# Patient Record
Sex: Male | Born: 1970 | Race: White | Hispanic: No | Marital: Married | Smoking: Never smoker
Health system: Southern US, Community
[De-identification: ages and names within clinical notes are randomized; demographics above are authoritative.]

## PROBLEM LIST (undated history)

## (undated) DIAGNOSIS — J302 Other seasonal allergic rhinitis: Secondary | ICD-10-CM

## (undated) DIAGNOSIS — L4050 Arthropathic psoriasis, unspecified: Secondary | ICD-10-CM

## (undated) HISTORY — DX: Other seasonal allergic rhinitis: J30.2

## (undated) HISTORY — DX: Arthropathic psoriasis, unspecified: L40.50

---

## 2011-04-03 ENCOUNTER — Encounter: Payer: Self-pay | Admitting: Family Medicine

## 2011-04-03 ENCOUNTER — Ambulatory Visit (INDEPENDENT_AMBULATORY_CARE_PROVIDER_SITE_OTHER): Payer: Managed Care, Other (non HMO) | Admitting: Family Medicine

## 2011-04-03 DIAGNOSIS — R002 Palpitations: Secondary | ICD-10-CM | POA: Insufficient documentation

## 2011-04-03 DIAGNOSIS — L4050 Arthropathic psoriasis, unspecified: Secondary | ICD-10-CM

## 2011-04-03 DIAGNOSIS — Z0000 Encounter for general adult medical examination without abnormal findings: Secondary | ICD-10-CM

## 2011-04-03 NOTE — Assessment & Plan Note (Signed)
Reviewed preventative protocols and updated unless pt declined. Return fasting for blood work. Discussed healthy living, diet. rtc 1-2 yrs for f/u or as needed.

## 2011-04-03 NOTE — Patient Instructions (Signed)
Good to meet you today I think the heart flutter was likely from overdoing EtOH.  Try to limit intake.  If it happens again, please return to be seen. physcial today - think about flu and tetanus shot. Return at your convenience for fasting bloodwork.

## 2011-04-03 NOTE — Progress Notes (Signed)
Subjective:    Patient ID: James Hogan, male    DOB: Nov 12, 1970, 40 y.o.   MRN: [id number]  HPI CC; new pt, establish  Recently turned 40, would like CPE.  No prior PCP.  Heart palpitations - recent trip to [place] for birthday - overdid EtOH.  Was sitting and when stood up felt "flutter" of heart, lasted a few seconds.  Sat down and resolved with rest.  No problems since.  No hangover.  Denies CP, SOB, tightness, cough, wheeze, dizziness, HA.  Stays active.  Yesterday ran 2 mi on treadmill, felt fine.  No further palpitations.  Recently did P90X, lost 26 lbs.  Tries to run 3 mi QOD.  Psoriasis - seen derm, currently using OTC psoriasis cream.  Improved with tanning bed in past.  Possible arthritis, worse L 2nd toe - some deformity per pt.  Not currnetly bothering him.  Seen podiatrist in past.  Preventative: Declines flu and tetanus although due.  Afraid of needles.  Medications and allergies reviewed and updated in chart.  Past histories reviewed and updated if relevant as below. Patient Active Problem List  Diagnoses  . Healthcare maintenance  . Palpitation  . Psoriatic arthritis   Past Medical History  Diagnosis Date  . Psoriatic arthritis     with psoriasis  . Seasonal allergies   . History of chicken pox    History reviewed. No pertinent past surgical history. History  Substance Use Topics  . Smoking status: Never Smoker   . Smokeless tobacco: Not on file  . Alcohol Use: Yes     on weekends   Family History  Problem Relation Age of Onset  . Aortic dissection Father   . Hypertension Father   . Psoriasis Father   . Coronary artery disease Maternal Grandfather   . Cancer Paternal Grandfather     lung, smoker  . Diabetes Neg Hx   . Hyperlipidemia Neg Hx    Allergies no known allergies No current outpatient prescriptions on file prior to visit.   Review of Systems  Constitutional: Negative for fever, chills, activity change, appetite change,  fatigue and unexpected weight change.  HENT: Negative for hearing loss and neck pain.   Eyes: Negative for visual disturbance.  Respiratory: Negative for cough, chest tightness, shortness of breath and wheezing.   Cardiovascular: Negative for chest pain, palpitations and leg swelling.  Gastrointestinal: Negative for nausea, vomiting, abdominal pain, diarrhea, constipation, blood in stool and abdominal distention.  Genitourinary: Negative for hematuria and difficulty urinating.  Musculoskeletal: Negative for myalgias and arthralgias.  Skin: Negative for rash.  Neurological: Negative for dizziness, seizures, syncope and headaches.  Hematological: Does not bruise/bleed easily.  Psychiatric/Behavioral: Negative for dysphoric mood. The patient is not nervous/anxious.        Objective:   Physical Exam  Nursing note and vitals reviewed. Constitutional: He is oriented to person, place, and time. He appears well-developed and well-nourished. No distress.  HENT:  Head: Normocephalic and atraumatic.  Right Ear: External ear normal.  Left Ear: External ear normal.  Nose: Nose normal.  Mouth/Throat: Oropharynx is clear and moist. No oropharyngeal exudate.  Eyes: Conjunctivae and EOM are normal. Pupils are equal, round, and reactive to light.  Neck: Normal range of motion. Neck supple. No thyromegaly present.  Cardiovascular: Normal rate, regular rhythm, normal heart sounds and intact distal pulses.   No murmur heard. Pulses:      Radial pulses are 2+ on the right side, and 2+ on the left side.  Pulmonary/Chest: Effort normal and breath sounds normal. No respiratory distress. He has no wheezes. He has no rales.  Abdominal: Soft. Bowel sounds are normal. He exhibits no distension and no mass. There is no tenderness. There is no rebound and no guarding.  Musculoskeletal: Normal range of motion. He exhibits no edema.  Lymphadenopathy:    He has no cervical adenopathy.  Neurological: He is alert  and oriented to person, place, and time.       CN grossly intact, station and gait intact  Skin: Skin is warm and dry. No rash noted.       Pitting of finger nails  Psychiatric: He has a normal mood and affect. His behavior is normal. Judgment and thought content normal.          Assessment & Plan:

## 2011-04-04 NOTE — Assessment & Plan Note (Signed)
Anticipate due to increase in EtOH intake during that period. Recommend limiting intake. If palpitations occur again, return for further evaluation. Check blood work to r/o other cause

## 2012-12-10 ENCOUNTER — Telehealth: Payer: Self-pay | Admitting: Family Medicine

## 2012-12-10 NOTE — Telephone Encounter (Signed)
Patient called for a physical but he wants to change his primary doctor from Dr. Sharen Hones to Dr. Alphonsus Sias.  He said he wanted a doctor with more experience.  Please advise as to your wishes on this request.  [phone]

## 2012-12-10 NOTE — Telephone Encounter (Signed)
I've seen him once.  Fine by me.

## 2012-12-11 NOTE — Telephone Encounter (Signed)
Okay to set up appt with me

## 2012-12-11 NOTE — Telephone Encounter (Signed)
I left a message on patient's voice mail that he can switch to Dr.Letvak.  I asked him to call me back, if he needs to schedule an appointment.

## 2013-03-04 ENCOUNTER — Encounter: Payer: Self-pay | Admitting: Internal Medicine

## 2013-03-04 ENCOUNTER — Ambulatory Visit (INDEPENDENT_AMBULATORY_CARE_PROVIDER_SITE_OTHER): Payer: Managed Care, Other (non HMO) | Admitting: Internal Medicine

## 2013-03-04 VITALS — BP 108/68 | HR 56 | Temp 98.2°F | Ht 70.0 in | Wt 226.0 lb

## 2013-03-04 DIAGNOSIS — Z23 Encounter for immunization: Secondary | ICD-10-CM

## 2013-03-04 DIAGNOSIS — Z0000 Encounter for general adult medical examination without abnormal findings: Secondary | ICD-10-CM

## 2013-03-04 DIAGNOSIS — L4050 Arthropathic psoriasis, unspecified: Secondary | ICD-10-CM

## 2013-03-04 LAB — LIPID PANEL
Cholesterol: 215 mg/dL — ABNORMAL HIGH (ref 0–200)
HDL: 35.6 mg/dL — ABNORMAL LOW (ref >39.00)
Total CHOL/HDL Ratio: 6
VLDL: 42.8 mg/dL — ABNORMAL HIGH (ref 0.0–40.0)

## 2013-03-04 NOTE — Patient Instructions (Signed)
DASH Diet  The DASH diet stands for "Dietary Approaches to Stop Hypertension." It is a healthy eating plan that has been shown to reduce high blood pressure (hypertension) in as little as 14 days, while also possibly providing other significant health benefits. These other health benefits include reducing the risk of breast cancer after menopause and reducing the risk of type 2 diabetes, heart disease, colon cancer, and stroke. Health benefits also include weight loss and slowing kidney failure in patients with chronic kidney disease.   DIET GUIDELINES  · Limit salt (sodium). Your diet should contain less than 1500 mg of sodium daily.  · Limit refined or processed carbohydrates. Your diet should include mostly whole grains. Desserts and added sugars should be used sparingly.  · Include small amounts of heart-healthy fats. These types of fats include nuts, oils, and tub margarine. Limit saturated and trans fats. These fats have been shown to be harmful in the body.  CHOOSING FOODS   The following food groups are based on a 2000 calorie diet. See your Registered Dietitian for individual calorie needs.  Grains and Grain Products (6 to 8 servings daily)  · Eat More Often: Whole-wheat bread, brown rice, whole-grain or wheat pasta, quinoa, popcorn without added fat or salt (air popped).  · Eat Less Often: White bread, white pasta, white rice, cornbread.  Vegetables (4 to 5 servings daily)  · Eat More Often: Fresh, frozen, and canned vegetables. Vegetables may be raw, steamed, roasted, or grilled with a minimal amount of fat.  · Eat Less Often/Avoid: Creamed or fried vegetables. Vegetables in a cheese sauce.  Fruit (4 to 5 servings daily)  · Eat More Often: All fresh, canned (in natural juice), or frozen fruits. Dried fruits without added sugar. One hundred percent fruit juice (½ cup [237 mL] daily).  · Eat Less Often: Dried fruits with added sugar. Canned fruit in light or heavy syrup.  Lean Meats, Fish, and Poultry (2  servings or less daily. One serving is 3 to 4 oz [85-114 g]).  · Eat More Often: Ninety percent or leaner ground beef, tenderloin, sirloin. Round cuts of beef, chicken breast, turkey breast. All fish. Grill, bake, or broil your meat. Nothing should be fried.  · Eat Less Often/Avoid: Fatty cuts of meat, turkey, or chicken leg, thigh, or wing. Fried cuts of meat or fish.  Dairy (2 to 3 servings)  · Eat More Often: Low-fat or fat-free milk, low-fat plain or light yogurt, reduced-fat or part-skim cheese.  · Eat Less Often/Avoid: Milk (whole, 2%). Whole milk yogurt. Full-fat cheeses.  Nuts, Seeds, and Legumes (4 to 5 servings per week)  · Eat More Often: All without added salt.  · Eat Less Often/Avoid: Salted nuts and seeds, canned beans with added salt.  Fats and Sweets (limited)  · Eat More Often: Vegetable oils, tub margarines without trans fats, sugar-free gelatin. Mayonnaise and salad dressings.  · Eat Less Often/Avoid: Coconut oils, palm oils, butter, stick margarine, cream, half and half, cookies, candy, pie.  FOR MORE INFORMATION  The Dash Diet Eating Plan: www.dashdiet.org  Document Released: 06/21/2011 Document Revised: 09/24/2011 Document Reviewed: 06/21/2011  ExitCare® Patient Information ©2014 [employer].

## 2013-03-04 NOTE — Assessment & Plan Note (Signed)
Healthy Counseling done Will check cholesterol--no Rx for now even if high Check glucose Tdap

## 2013-03-04 NOTE — Progress Notes (Signed)
Subjective:    Patient ID: James Hogan, male    DOB: 1971-01-08, 42 y.o.   MRN: [id number]  HPI Here for physical Doing okay for the most part Mom had recent chest pain and found to have CAD and needed stent  Has been going to gym regularly ([employer]) Aerobic and weights Has been trimming down this summer  Psoriasis is mild Only using OTC creams for now  No current outpatient prescriptions on file prior to visit.   No current facility-administered medications on file prior to visit.    No Known Allergies  Past Medical History  Diagnosis Date  . Psoriatic arthritis     with minimal joint involvement.  . Seasonal allergies     No past surgical history on file.  Family History  Problem Relation Age of Onset  . Aortic dissection Father   . Hypertension Father   . Psoriasis Father   . Coronary artery disease Maternal Grandfather   . Cancer Paternal Grandfather     lung, smoker  . Diabetes Neg Hx   . Hyperlipidemia Neg Hx   . Heart disease Mother     History   Social History  . Marital Status: Married    Spouse Name: N/A    Number of Children: 2  . Years of Education: N/A   Occupational History  . Returns Therapist, music   Social History Main Topics  . Smoking status: Never Smoker   . Smokeless tobacco: Never Used  . Alcohol Use: Yes     Comment: on weekends  . Drug Use: No  . Sexual Activity: Not on file   Other Topics Concern  . Not on file   Social History Narrative   2nd marriage   His 2 children with mom in [place] most of the time   Wife has 3 children-- 1 daughter stays with them    Review of Systems  Constitutional: Negative for fatigue and unexpected weight change.       Wears seat belt  HENT: Positive for congestion and rhinorrhea. Negative for hearing loss, dental problem and tinnitus.        Regular with dentist  Eyes: Negative for visual disturbance.       No diplopia or unilateral vision loss  Respiratory: Negative  for cough, chest tightness and shortness of breath.   Cardiovascular: Negative for chest pain, palpitations and leg swelling.  Gastrointestinal: Negative for nausea, vomiting, abdominal pain, constipation and blood in stool.       No heartburn  Endocrine: Negative for cold intolerance and heat intolerance.  Genitourinary: Negative for urgency, frequency and difficulty urinating.       No sexual problems  Musculoskeletal: Positive for arthralgias. Negative for back pain and joint swelling.       Right wrist, thumb and ankle pain in past---not much recently  Skin: Positive for rash.       Small areas of psoriasis  Allergic/Immunologic: Positive for environmental allergies. Negative for immunocompromised state.       Uses loratadine prn  Neurological: Negative for dizziness, syncope, weakness, light-headedness, numbness and headaches.  Hematological: Negative for adenopathy. Does not bruise/bleed easily.  Psychiatric/Behavioral: Negative for sleep disturbance and dysphoric mood. The patient is nervous/anxious.        Mild situational anxiety---not regular       Objective:   Physical Exam  Constitutional: He is oriented to person, place, and time. He appears well-developed and well-nourished. No distress.  HENT:  Head: Normocephalic and atraumatic.  Right Ear: External ear normal.  Left Ear: External ear normal.  Mouth/Throat: Oropharynx is clear and moist. No oropharyngeal exudate.  Eyes: Conjunctivae and EOM are normal. Pupils are equal, round, and reactive to light.  Neck: Normal range of motion. Neck supple. No thyromegaly present.  Cardiovascular: Normal rate, regular rhythm, normal heart sounds and intact distal pulses.  Exam reveals no gallop.   No murmur heard. Pulmonary/Chest: Effort normal and breath sounds normal. No respiratory distress. He has no wheezes. He has no rales.  Abdominal: Soft. There is no tenderness.  Musculoskeletal: He exhibits no edema and no tenderness.   No active synovitis  Lymphadenopathy:    He has no cervical adenopathy.  Neurological: He is alert and oriented to person, place, and time.  Skin: Rash noted.  Scattered small psoriatic plaques  Psychiatric: He has a normal mood and affect. His behavior is normal.          Assessment & Plan:

## 2013-03-04 NOTE — Addendum Note (Signed)
Addended by: Sueanne Margarita on: 03/04/2013 11:23 AM   Modules accepted: Orders

## 2013-03-04 NOTE — Assessment & Plan Note (Signed)
Quiet now without Rx other than OTC cream for rash

## 2013-05-21 ENCOUNTER — Other Ambulatory Visit: Payer: Self-pay

## 2013-08-10 ENCOUNTER — Ambulatory Visit (INDEPENDENT_AMBULATORY_CARE_PROVIDER_SITE_OTHER): Payer: Managed Care, Other (non HMO) | Admitting: Internal Medicine

## 2013-08-10 ENCOUNTER — Encounter: Payer: Self-pay | Admitting: Internal Medicine

## 2013-08-10 VITALS — BP 120/80 | HR 64 | Temp 98.1°F | Wt 230.0 lb

## 2013-08-10 DIAGNOSIS — L02419 Cutaneous abscess of limb, unspecified: Secondary | ICD-10-CM

## 2013-08-10 DIAGNOSIS — L03119 Cellulitis of unspecified part of limb: Secondary | ICD-10-CM

## 2013-08-10 DIAGNOSIS — L03115 Cellulitis of right lower limb: Secondary | ICD-10-CM

## 2013-08-10 MED ORDER — CLINDAMYCIN HCL 300 MG PO CAPS: 300.0000 mg | ORAL_CAPSULE | Freq: Three times a day (TID) | ORAL | Status: DC

## 2013-08-10 NOTE — Progress Notes (Signed)
Pre-visit discussion using our clinic review tool. No additional management support is needed unless otherwise documented below in the visit note.  

## 2013-08-10 NOTE — Assessment & Plan Note (Signed)
Suspicious for MRSA Will treat with clinda If not better in 2-3 days, will change to septra

## 2013-08-10 NOTE — Patient Instructions (Signed)
MRSA Overview  MRSA stands for methicillin-resistant Staphylococcus aureus. It is a type of bacteria that is resistant to some common antibiotics. It can cause infections in the skin and many other places in the body. Staphylococcus aureus, often called "staph," is a bacteria that normally lives on the skin or in the nose. Staph on the surface of the skin or in the nose does not cause problems. However, if the staph enters the body through a cut, wound, or break in the skin, an infection can happen.  Up until recently, infections with the MRSA type of staph mainly occurred in hospitals and other healthcare settings. There are now increasing problems with MRSA infections in the community as well. Infections with MRSA may be very serious or even life-threatening. Most MRSA infections are acquired in one of two ways:  · Healthcare-associated MRSA (HA-MRSA)  · This can be acquired by people in any healthcare setting. MRSA can be a big problem for hospitalized people, people in nursing homes, people in rehabilitation facilities, people with weakened immune systems, dialysis patients, and those who have had surgery.  · Community-associated MRSA (CA-MRSA)  · Community spread of MRSA is becoming more common. It is known to spread in crowded settings, in jails and prisons, and in situations where there is close skin-to-skin contact, such as during sporting events or in locker rooms. MRSA can be spread through shared items, such as children's toys, razors, towels, or sports equipment.  CAUSES   All staph, including MRSA, are normally harmless unless they enter the body through a scratch, cut, or wound, such as with surgery. All staph, including MRSA, can be spread from person-to-person by touching contaminated objects or through direct contact.  SPECIAL GROUPS  MRSA can present problems for special groups of people. Some of these groups include:  · Breastfeeding women.  · The most common problem is MRSA infection of the  breast (mastitis). There is evidence that MRSA can be passed to an infant from infected breast milk. Your caregiver may recommend that you stop breastfeeding until the mastitis is under control.  · If you are breastfeeding and have a MRSA infection in a place other than the breast, you may usually continue breastfeeding while under treatment. If taking antibiotics, ask your caregiver if it is safe to continue breastfeeding while taking your prescribed medicines.  · Neonates (babies from birth to 1 month old) and infants (babies from 1 month to 1 year old).  · There is evidence that MRSA can be passed to a newborn at birth if the mother has MRSA on the skin, in or around the birth canal, or an infection in the uterus, cervix, or vagina. MRSA infection can have the same appearance as a normal newborn or infant rash or several other skin infections. This can make it hard to diagnose MRSA.  · Immune compromised people.  · If you have an immune system problem, you may have a higher chance of developing a MRSA infection.  · People after any type of surgery.  · Staph in general, including MRSA, is the most common cause of infections occurring at the site of recent surgery.  · People on long-term steroid medicines.  · These kinds of medicines can lower your resistance to infection. This can increase your chance of getting MRSA.  · People who have had frequent hospitalizations, live in nursing homes or other residential care facilities, have venous or urinary catheters, or have taken multiple courses of antibiotic therapy for any reason.    DIAGNOSIS   Diagnosis of MRSA is done by cultures of fluid samples that may come from:  · Swabs taken from cuts or wounds in infected areas.  · Nasal swabs.  · Saliva or deep cough specimens from the lungs (sputum).  · Urine.  · Blood.  Many people are "colonized" with MRSA but have no signs of infection. This means that people carry the MRSA germ on their skin or in their nose and may  never develop MRSA infection.   TREATMENT   Treatment varies and is based on how serious, how deep, or how extensive the infection is. For example:  · Some skin infections, such as a small boil or abscess, may be treated by draining yellowish-white fluid (pus) from the site of the infection.  · Deeper or more widespread soft tissue infections are usually treated with surgery to drain pus and with antibiotic medicine given by vein or by mouth. This may be recommended even if you are pregnant.  · Serious infections may require a hospital stay.  If antibiotics are given, they may be needed for several weeks.  PREVENTION   Because many people are colonized with staph, including MRSA, preventing the spread of the bacteria from person-to-person is most important. The best way to prevent the spread of bacteria and other germs is through proper hand washing or by using alcohol-based hand disinfectants. The following are other ways to help prevent MRSA infection within the hospital and community settings.   · Healthcare settings:  · Strict hand washing or hand disinfection procedures need to be followed before and after touching every patient.  · Patients infected with MRSA are placed in isolation to prevent the spread of the bacteria.  · Healthcare workers need to wear disposable gowns and gloves when touching or caring for patients infected with MRSA. Visitors may also be asked to wear a gown and gloves.  · Hospital surfaces need to be disinfected frequently.  · Community settings:  · Wash your hands frequently with soap and water for at least 15 seconds. Otherwise, use alcohol-based hand disinfectants when soap and water is not available.  · Make sure people who live with you wash their hands often, too.  · Do not share personal items. For example, avoid sharing razors and other personal hygiene items, towels, clothing, and athletic equipment.  · Wash and dry your clothes and bedding at the warmest temperatures  recommended on the labels.  · Keep wounds covered. Pus from infected sores may contain MRSA and other bacteria. Keep cuts and abrasions clean and covered with germ-free (sterile), dry bandages until they are healed.  · If you have a wound that appears infected, ask your caregiver if a culture for MRSA and other bacteria should be done.  · If you are breastfeeding, talk to your caregiver about MRSA. You may be asked to temporarily stop breastfeeding.  HOME CARE INSTRUCTIONS   · Take your antibiotics as directed. Finish them even if you start to feel better.  · Avoid close contact with those around you as much as possible. Do not use towels, razors, toothbrushes, bedding, or other items that will be used by others.  · To fight the infection, follow your caregiver's instructions for wound care. Wash your hands before and after changing your bandages.  · If you have an intravascular device, such as a catheter, make sure you know how to care for it.  · Be sure to tell any healthcare providers that you have MRSA   so they are aware of your infection.  SEEK IMMEDIATE MEDICAL CARE IF:   · The infection appears to be getting worse. Signs include:  · Increased warmth, redness, or tenderness around the wound site.  · A red line that extends from the infection site.  · A dark color in the area around the infection.  · Wound drainage that is tan, yellow, or green.  · A bad smell coming from the wound.  · You feel sick to your stomach (nauseous) and throw up (vomit) or cannot keep medicine down.  · You have a fever.  · Your baby is older than 3 months with a rectal temperature of 102° F (38.9° C) or higher.  · Your baby is 3 months old or younger with a rectal temperature of 100.4° F (38° C) or higher.  · You have difficulty breathing.  MAKE SURE YOU:   · Understand these instructions.  · Will watch your condition.  · Will get help right away if you are not doing well or get worse.  Document Released: 07/02/2005 Document Revised:  09/24/2011 Document Reviewed: 10/04/2010  ExitCare® Patient Information ©2014 [employer].

## 2013-08-10 NOTE — Progress Notes (Signed)
   Subjective:    Patient ID: James Hogan, male    DOB: 09/22/1970, 43 y.o.   MRN: [id number]  HPI Bumped his right shin 3 days ago Just a mild bump Then got blister which broke last night  Now with redness No fever  No exposure to insects  No current outpatient prescriptions on file prior to visit.   No current facility-administered medications on file prior to visit.    No Known Allergies  Past Medical History  Diagnosis Date  . Psoriatic arthritis     with minimal joint involvement.  . Seasonal allergies     No past surgical history on file.  Family History  Problem Relation Age of Onset  . Aortic dissection Father   . Hypertension Father   . Psoriasis Father   . Coronary artery disease Maternal Grandfather   . Cancer Paternal Grandfather     lung, smoker  . Diabetes Neg Hx   . Hyperlipidemia Neg Hx   . Heart disease Mother     History   Social History  . Marital Status: Married    Spouse Name: N/A    Number of Children: 2  . Years of Education: N/A   Occupational History  . Returns Therapist, musicmanager     [employer]   Social History Main Topics  . Smoking status: Never Smoker   . Smokeless tobacco: Never Used  . Alcohol Use: Yes     Comment: on weekends  . Drug Use: No  . Sexual Activity: Not on file   Other Topics Concern  . Not on file   Social History Narrative   2nd marriage   His 2 children with mom in [place] most of the time   Wife has 3 children-- 1 daughter stays with them    Review of Systems No skin problems in family Feels fine     Objective:   Physical Exam  Constitutional: He appears well-developed and well-nourished. No distress.  Skin:  ~11-5112mm ulcer with black eschar on mid right calf ~3cm of surrounding erythema Some tenderness          Assessment & Plan:

## 2014-03-14 ENCOUNTER — Emergency Department: Payer: Self-pay | Admitting: Emergency Medicine

## 2014-03-17 ENCOUNTER — Telehealth: Payer: Self-pay | Admitting: Internal Medicine

## 2014-03-17 ENCOUNTER — Encounter: Payer: Self-pay | Admitting: Internal Medicine

## 2014-03-17 DIAGNOSIS — M7732 Calcaneal spur, left foot: Secondary | ICD-10-CM

## 2014-03-17 NOTE — Telephone Encounter (Signed)
Pt was seen at [facility] ER on Sunday 03/14/2014 for bone spur on left heel. Pt would like a referral to an orthopedic specialist. Thank you

## 2014-05-12 ENCOUNTER — Ambulatory Visit (INDEPENDENT_AMBULATORY_CARE_PROVIDER_SITE_OTHER): Payer: Managed Care, Other (non HMO) | Admitting: Internal Medicine

## 2014-05-12 ENCOUNTER — Encounter: Payer: Self-pay | Admitting: Internal Medicine

## 2014-05-12 VITALS — BP 110/70 | HR 68 | Temp 98.2°F | Ht 70.5 in | Wt 229.0 lb

## 2014-05-12 DIAGNOSIS — Z0000 Encounter for general adult medical examination without abnormal findings: Secondary | ICD-10-CM

## 2014-05-12 NOTE — Progress Notes (Signed)
Pre visit review using our clinic review tool, if applicable. No additional management support is needed unless otherwise documented below in the visit note. 

## 2014-05-12 NOTE — Assessment & Plan Note (Signed)
Healthy Overweight but not obese (has increased muscle mass) Discussed fitness He prefers no flu shot Will defer labs this year

## 2014-05-12 NOTE — Progress Notes (Signed)
Subjective:    Patient ID: James Hogan, male    DOB: 10/31/1970, 43 y.o.   MRN: [id number]  HPI Here for physical  Did see Dr Thurston HoleWainer about the heel spur Has gone to PT a couple of times He thinks he has some tendonitis Wore a boot a while and had initial prednisone pack  Has restarted exercise but at lower intensity Hopes to get back to basketball soon Regular with weights in gym  Monitors weight well Not obese---BMI not correct for him with his muscle mass  Has noted some low volume ringing in his ears Hearing is okay Truck is loud--discussed noise issues  No current outpatient prescriptions on file prior to visit.   No current facility-administered medications on file prior to visit.    No Known Allergies  Past Medical History  Diagnosis Date  . Psoriatic arthritis     with minimal joint involvement.  . Seasonal allergies     No past surgical history on file.  Family History  Problem Relation Age of Onset  . Aortic dissection Father   . Hypertension Father   . Psoriasis Father   . Coronary artery disease Maternal Grandfather   . Cancer Paternal Grandfather     lung, smoker  . Diabetes Neg Hx   . Hyperlipidemia Neg Hx   . Heart disease Mother     History   Social History  . Marital Status: Married    Spouse Name: N/A    Number of Children: 2  . Years of Education: N/A   Occupational History  . Returns Therapist, musicmanager     [employer]   Social History Main Topics  . Smoking status: Never Smoker   . Smokeless tobacco: Never Used  . Alcohol Use: Yes     Comment: on weekends  . Drug Use: No  . Sexual Activity: Not on file   Other Topics Concern  . Not on file   Social History Narrative   2nd marriage   [place] in [place] most of [place] time   Wife has 3 children-- 1 daughter stays with them    Review of Systems  Constitutional: Negative for fatigue and unexpected weight change.       Wears seat belt  HENT: Negative for dental  problem, hearing loss and tinnitus.        Regular with dentist  Eyes: Negative for visual disturbance.       No diplopia or unilateral vision loss  Gastrointestinal: Negative for nausea, vomiting, abdominal pain, constipation and blood in stool.       No heartburn  Endocrine: Negative for polydipsia and polyuria.  Genitourinary: Negative for urgency, frequency and difficulty urinating.       No sexual problems  Musculoskeletal: Negative for arthralgias, back pain and joint swelling.       Occ stiff joints  Skin: Positive for rash.       Psoriasis flaring a little with the colder weather Uses OTC moisturizing products  Allergic/Immunologic: Negative for environmental allergies and immunocompromised state.  Neurological: Negative for dizziness, syncope, weakness, light-headedness, numbness and headaches.  Hematological: Negative for adenopathy. Does not bruise/bleed easily.  Psychiatric/Behavioral: Negative for sleep disturbance and dysphoric mood. The patient is not nervous/anxious.        Some stress with step daughter       Objective:   Physical Exam  Constitutional: He is oriented to person, place, and time. He appears well-developed and well-nourished. No distress.  HENT:  Head: Normocephalic and atraumatic.  Right Ear: External ear normal.  Left Ear: External ear normal.  Mouth/Throat: Oropharynx is clear and moist. No oropharyngeal exudate.  Eyes: Conjunctivae and EOM are normal. Pupils are equal, round, and reactive to light.  Neck: Normal range of motion. Neck supple. No thyromegaly present.  Cardiovascular: Normal rate, regular rhythm, normal heart sounds and intact distal pulses.  Exam reveals no gallop.   No murmur heard. Pulmonary/Chest: Effort normal and breath sounds normal. No respiratory distress. He has no wheezes. He has no rales.  Abdominal: Soft. There is no tenderness.  Musculoskeletal: He exhibits no edema and no tenderness.  Lymphadenopathy:    He has no  cervical adenopathy.  Neurological: He is alert and oriented to person, place, and time.  Skin: No erythema.  Small psoriatic patches  Psychiatric: He has a normal mood and affect. His behavior is normal.          Assessment & Plan:

## 2015-08-18 IMAGING — CR DG FOOT COMPLETE 3+V*L*
1 series · 3 of 3 positions shown · non-contrast
Comparison: None.

CLINICAL DATA: Right heel pain for 3 days.

EXAM:
LEFT FOOT - COMPLETE 3+ VIEW

[Series 1: x foot ap left · 0.14mm/px · 3 of 3 slices shown]
[im 1/3]
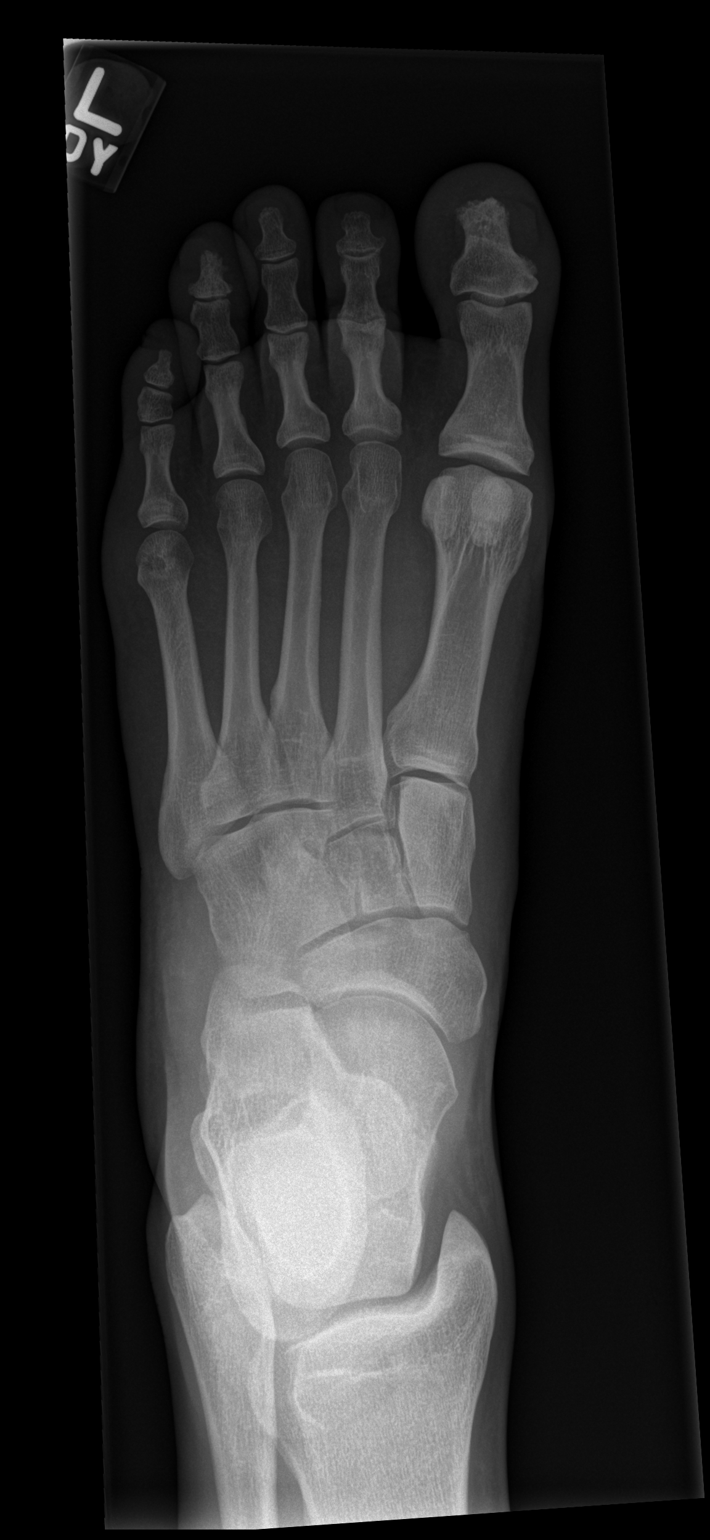
[im 2/3]
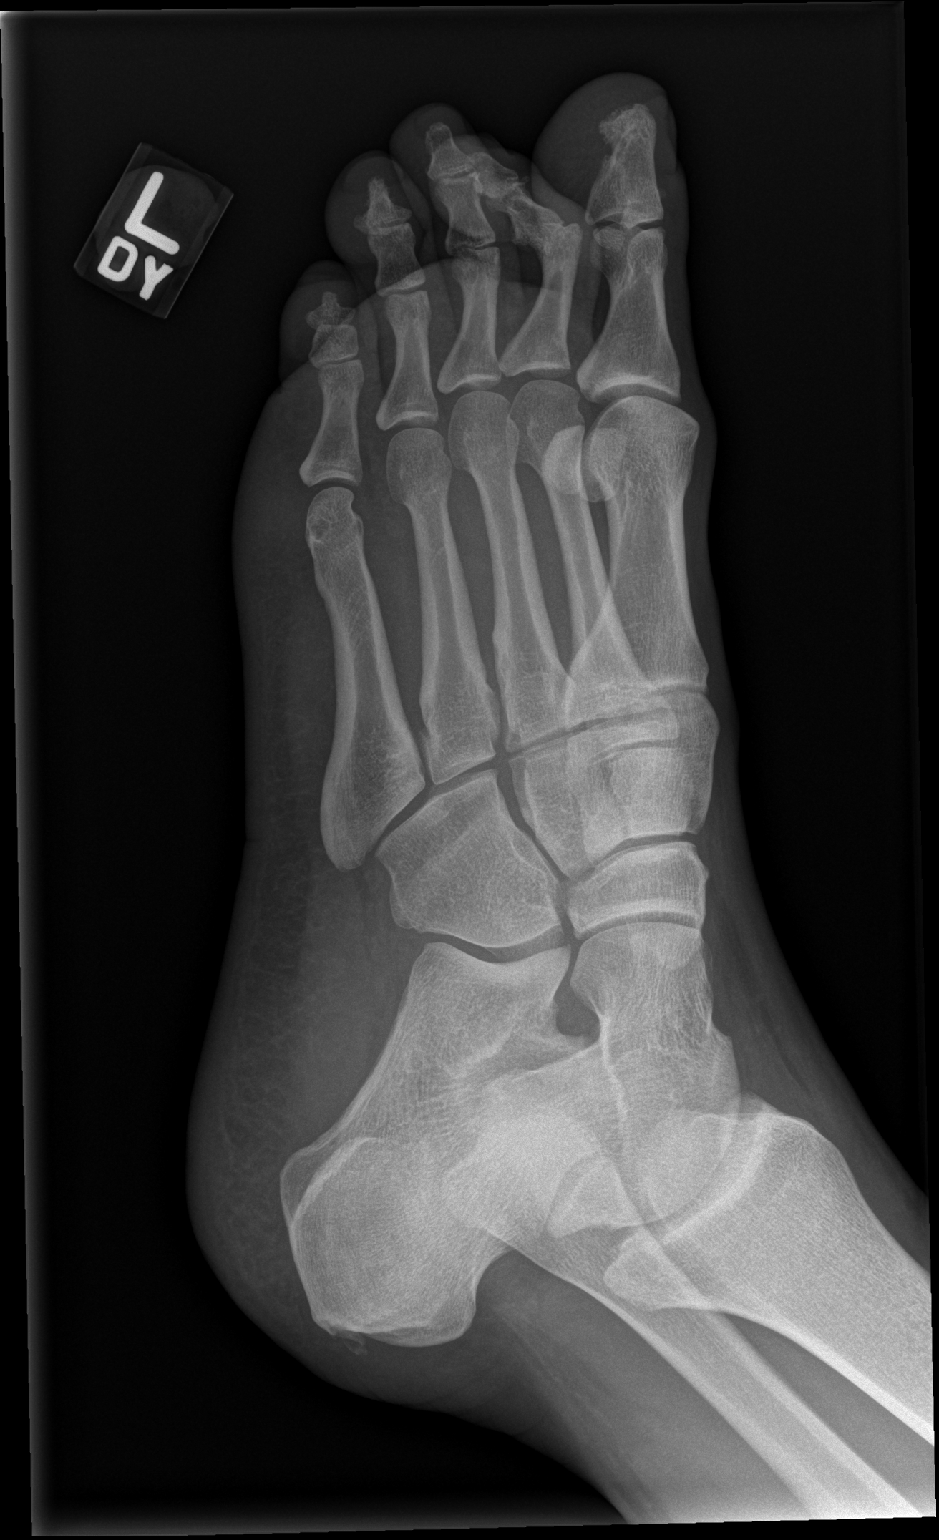
[im 3/3]
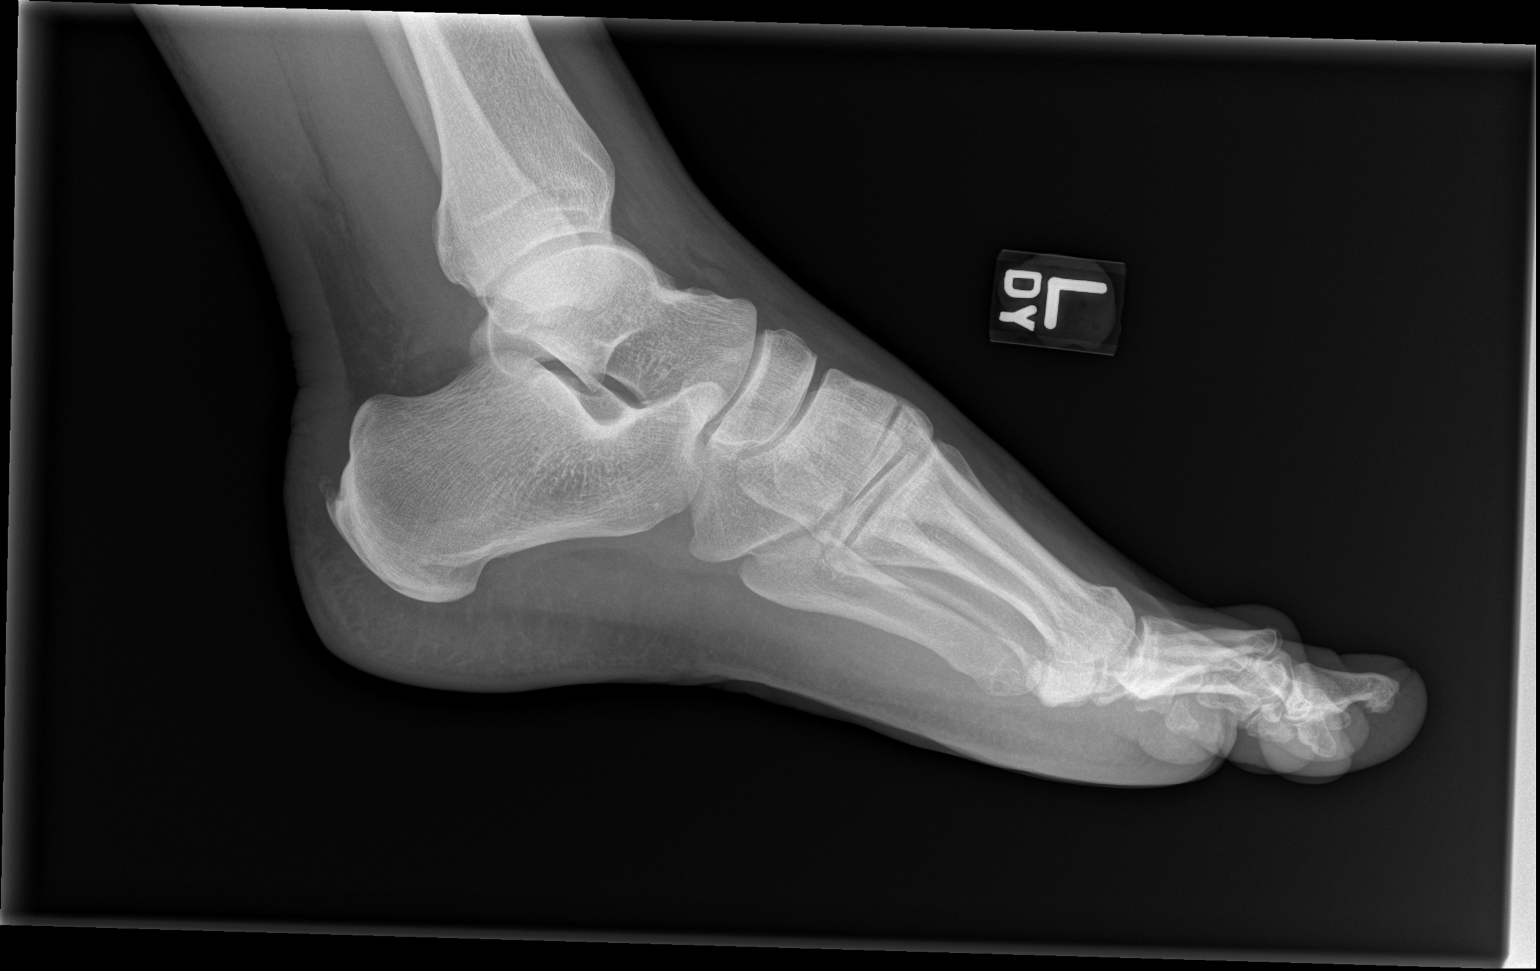

[3 of 3 positions shown; findings below may reference images not displayed]

FINDINGS: There is no evidence of acute fracture, subluxation or dislocation.

The Lisfranc joints are unremarkable.

The calcaneus and overlying soft tissues are unremarkable.

No suspicious focal bony lesions identified.
IMPRESSION: No evidence of acute or significant abnormality.
